# Patient Record
Sex: Female | Born: 1990 | Race: White | Hispanic: No | Marital: Married | Smoking: Never smoker
Health system: Southern US, Community
[De-identification: ages and names within clinical notes are randomized; demographics above are authoritative.]

## PROBLEM LIST (undated history)

## (undated) DIAGNOSIS — F419 Anxiety disorder, unspecified: Secondary | ICD-10-CM

## (undated) HISTORY — DX: Anxiety disorder, unspecified: F41.9

## (undated) HISTORY — PX: WISDOM TOOTH EXTRACTION: SHX21

---
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
## 2015-03-18 ENCOUNTER — Ambulatory Visit (INDEPENDENT_AMBULATORY_CARE_PROVIDER_SITE_OTHER): Payer: Managed Care, Other (non HMO) | Admitting: Physician Assistant

## 2015-03-18 VITALS — BP 116/80 | HR 95 | Temp 98.5°F | Resp 16 | Ht 67.0 in | Wt 139.2 lb

## 2015-03-18 DIAGNOSIS — N926 Irregular menstruation, unspecified: Secondary | ICD-10-CM | POA: Diagnosis not present

## 2015-03-18 LAB — POCT URINE PREGNANCY: PREG TEST UR: NEGATIVE

## 2015-03-18 NOTE — Patient Instructions (Addendum)
I will contact you with your lab results as soon as they are available.   If you have not heard from me in 2 weeks, please contact me.  The fastest way to get your results is to register for My Chart (see the instructions on the last page of this printout).   [facility]  [place] - [phone] [facility] [phone]- [phone] [facility] - [phone] [facility] - [phone]

## 2015-03-18 NOTE — Progress Notes (Signed)
   Erica [name]  MRN: [id number] DOB: 05/27/1991  Subjective:  Pt presents to clinic because her menses is 1.5 weeks late and she has taken multiple pregnancy tests at home but the one this morning had a faint line after the time frame for the test to be read.  She has increased fatigue, breast tenderness and pelvic cramping none of which are normal for her during her menses.  She is typically regular with her menses.  She recent moved to [place] (within the last 2 weeks) for her husbands job at [employer] law [employer].  This has not been stressful to patient.  They have not been trying to prevent pregnancy - she is getting married in 1.5 months and is excited if she is pregnant.  She is on Adderall but she does not take regularly - she has been on a PNV.  She has had rare social ETOH drinks over the last month.    There are no active problems to display for this patient.  No current outpatient prescriptions on file prior to visit.   No current facility-administered medications on file prior to visit.    No Known Allergies  Review of Systems  Constitutional: Negative for chills.  Genitourinary: Negative for dysuria and vaginal bleeding.   Objective:  BP 116/80 mmHg  Pulse 95  Temp(Src) 98.5 F (36.9 C) (Oral)  Resp 16  Ht 5\' 7"  (1.702 m)  Wt 139 lb 3.2 oz (63.141 kg)  BMI 21.80 kg/m2  SpO2 99%  LMP 02/07/2015  Physical Exam  Constitutional: She is oriented to person, place, and time and well-developed, well-nourished, and in no distress.  HENT:  Head: Normocephalic and atraumatic.  Right Ear: Hearing and external ear normal.  Left Ear: Hearing and external ear normal.  Eyes: Conjunctivae are normal.  Neck: Normal range of motion.  Cardiovascular: Normal rate, regular rhythm and normal heart sounds.   No murmur heard. Pulmonary/Chest: Effort normal and breath sounds normal. She has no wheezes.  Neurological: She is alert and oriented to person, place, and time. Gait normal.  Skin:  Skin is warm and dry.  Psychiatric: Mood, memory, affect and judgment normal.  Vitals reviewed.  I reviewed her pregnancy test that she brought in and it appears to have a line suggesting it was positive. Assessment and Plan :  Missed period - Plan: POCT urine pregnancy, hCG, quantitative, pregnancy  Due to how late her menses is and the fact that she brought a positive test from home we will do a quan hcg for confirmation.  She will continue her PNV.  Benny LennertSarah Weber PA-C  [facility] 03/18/2015 5:33 PM

## 2015-03-19 LAB — HCG, QUANTITATIVE, PREGNANCY: hCG, Beta Chain, Quant, S: <2 m[IU]/mL

## 2015-11-10 ENCOUNTER — Encounter: Payer: Self-pay | Admitting: Obstetrics and Gynecology

## 2015-11-10 ENCOUNTER — Ambulatory Visit (INDEPENDENT_AMBULATORY_CARE_PROVIDER_SITE_OTHER): Payer: 59 | Admitting: Obstetrics and Gynecology

## 2015-11-10 VITALS — BP 112/66 | HR 64 | Ht 67.75 in | Wt 145.0 lb

## 2015-11-10 DIAGNOSIS — Z01419 Encounter for gynecological examination (general) (routine) without abnormal findings: Secondary | ICD-10-CM

## 2015-11-10 DIAGNOSIS — O039 Complete or unspecified spontaneous abortion without complication: Secondary | ICD-10-CM | POA: Diagnosis not present

## 2015-11-10 DIAGNOSIS — Z0000 Encounter for general adult medical examination without abnormal findings: Secondary | ICD-10-CM

## 2015-11-10 DIAGNOSIS — Z113 Encounter for screening for infections with a predominantly sexual mode of transmission: Secondary | ICD-10-CM

## 2015-11-10 LAB — HEPATITIS C ANTIBODY: HCV AB: NEGATIVE

## 2015-11-10 LAB — CBC
HEMATOCRIT: 39.6 % (ref 36.0–46.0)
HEMOGLOBIN: 13.6 g/dL (ref 12.0–15.0)
MCH: 30.6 pg (ref 26.0–34.0)
MCHC: 34.3 g/dL (ref 30.0–36.0)
MCV: 89.2 fL (ref 78.0–100.0)
MPV: 10.9 fL (ref 8.6–12.4)
Platelets: 220 10*3/uL (ref 150–400)
RBC: 4.44 MIL/uL (ref 3.87–5.11)
RDW: 12.8 % (ref 11.5–15.5)
WBC: 5.2 10*3/uL (ref 4.0–10.5)

## 2015-11-10 LAB — COMPREHENSIVE METABOLIC PANEL
ALBUMIN: 4.2 g/dL (ref 3.6–5.1)
ALT: 13 U/L (ref 6–29)
AST: 18 U/L (ref 10–30)
Alkaline Phosphatase: 39 U/L (ref 33–115)
BUN: 10 mg/dL (ref 7–25)
CO2: 25 mmol/L (ref 20–31)
CREATININE: 0.64 mg/dL (ref 0.50–1.10)
Calcium: 10.1 mg/dL (ref 8.6–10.2)
Chloride: 105 mmol/L (ref 98–110)
Glucose, Bld: 88 mg/dL (ref 65–99)
POTASSIUM: 4.2 mmol/L (ref 3.5–5.3)
SODIUM: 139 mmol/L (ref 135–146)
TOTAL PROTEIN: 6.7 g/dL (ref 6.1–8.1)
Total Bilirubin: 0.5 mg/dL (ref 0.2–1.2)

## 2015-11-10 LAB — POCT URINALYSIS DIPSTICK
Bilirubin, UA: NEGATIVE
Blood, UA: NEGATIVE
Glucose, UA: NEGATIVE
Ketones, UA: NEGATIVE
LEUKOCYTES UA: NEGATIVE
NITRITE UA: NEGATIVE
PH UA: 7
Protein, UA: NEGATIVE
Urobilinogen, UA: NEGATIVE

## 2015-11-10 LAB — LIPID PANEL
CHOLESTEROL: 191 mg/dL (ref 125–200)
HDL: 66 mg/dL (ref >=46)
LDL Cholesterol: 108 mg/dL (ref <130)
Total CHOL/HDL Ratio: 2.9 Ratio (ref <=5.0)
Triglycerides: 85 mg/dL (ref <150)
VLDL: 17 mg/dL (ref <30)

## 2015-11-10 LAB — TSH: TSH: 2.19 m[IU]/L

## 2015-11-10 LAB — HEMOGLOBIN, FINGERSTICK: HEMOGLOBIN, FINGERSTICK: 13.2 g/dL (ref 12.0–16.0)

## 2015-11-10 NOTE — Patient Instructions (Signed)
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
## 2015-11-11 LAB — GC/CHLAMYDIA PROBE AMP
CT Probe RNA: NOT DETECTED
GC PROBE AMP APTIMA: NOT DETECTED

## 2015-11-11 LAB — STD PANEL
HEP B S AG: NEGATIVE
HIV 1&2 Ab, 4th Generation: NONREACTIVE

## 2015-12-28 ENCOUNTER — Ambulatory Visit (INDEPENDENT_AMBULATORY_CARE_PROVIDER_SITE_OTHER): Payer: Managed Care, Other (non HMO) | Admitting: Family Medicine

## 2015-12-28 VITALS — BP 110/76 | HR 91 | Temp 98.1°F | Resp 16 | Ht 67.0 in | Wt 144.0 lb

## 2015-12-28 DIAGNOSIS — J0100 Acute maxillary sinusitis, unspecified: Secondary | ICD-10-CM

## 2015-12-28 MED ORDER — AMOXICILLIN-POT CLAVULANATE 875-125 MG PO TABS: 1.0000 | ORAL_TABLET | Freq: Two times a day (BID) | ORAL | Status: DC

## 2015-12-28 NOTE — Patient Instructions (Addendum)
  Great to meet you!  Take all of the antibiotics, Augmentin is a strong antibiotic so I recommend eating yogurt daily or taking a pro-biotic.   Sinusitis, Adult Sinusitis is redness, soreness, and puffiness (inflammation) of the air pockets in the bones of your face (sinuses). The redness, soreness, and puffiness can cause air and mucus to get trapped in your sinuses. This can allow germs to grow and cause an infection.  HOME CARE   Drink enough fluids to keep your pee (urine) clear or pale yellow.  Use a humidifier in your home.  Run a hot shower to create steam in the bathroom. Sit in the bathroom with the door closed. Breathe in the steam 3-4 times a day.  Put a warm, moist washcloth on your face 3-4 times a day, or as told by your doctor.  Use salt water sprays (saline sprays) to wet the thick fluid in your nose. This can help the sinuses drain.  Only take medicine as told by your doctor. GET HELP RIGHT AWAY IF:   Your pain gets worse.  You have very bad headaches.  You are sick to your stomach (nauseous).  You throw up (vomit).  You are very sleepy (drowsy) all the time.  Your face is puffy (swollen).  Your vision changes.  You have a stiff neck.  You have trouble breathing. MAKE SURE YOU:   Understand these instructions.  Will watch your condition.  Will get help right away if you are not doing well or get worse.   This information is not intended to replace advice given to you by your health care provider. Make sure you discuss any questions you have with your health care provider.   Document Released: 02/13/2008 Document Revised: 09/17/2014 Document Reviewed: 04/01/2012 Elsevier Interactive Patient Education [employer]2016 Elsevier Inc.

## 2015-12-28 NOTE — Progress Notes (Signed)
   HPI  Patient presents today with flu like symptoms  She c/o 5-6 days of illness, She had fever to 102 over the weekend with headache, and malaise. She has begun to improve but has now developed severe R sided facial pain and congestion for 3 days. .  Her cough is improving. She has also developed sore throat  Her father in law is a Recruitment consultantneonatologist and wondered if she had strep.   She is tolerating food and fluids normally.  She has no dyspnea, she denies chest pain.   PMH: Smoking status noted ROS: Per HPI  Objective: BP 110/76 mmHg  Pulse 91  Temp(Src) 98.1 F (36.7 C) (Oral)  Resp 16  Ht 5\' 7"  (1.702 m)  Wt 144 lb (65.318 kg)  BMI 22.55 kg/m2  SpO2 98%  LMP 12/21/2015 (Approximate) Gen: NAD, alert, cooperative with exam HEENT: NCAT, R maxilar sinus pain to palpation, TMs WNL, oropharynx clear with minimally  CV: RRR, good S1/S2, no murmur Resp: CTABL, no wheezes, non-labored Abd: SNTND, BS present, no guarding or organomegaly Ext: No edema, warm Neuro: Alert and oriented, No gross deficits  Assessment and plan:  # acute maxillary sinusitis Treat with Augmentin Likely preceding flu Discussed usual course of illness.  RTC with any concerns or worsening symptoms.       Meds ordered this encounter  Medications  . Fexofenadine HCl (MUCINEX ALLERGY PO)    Sig: Take by mouth.  Marland Kitchen. amoxicillin-clavulanate (AUGMENTIN) 875-125 MG tablet    Sig: Take 1 tablet by mouth 2 (two) times daily.    Dispense:  20 tablet    Refill:  0    Kevin FentonSamuel Bradshaw, MD 1:50 PM

## 2016-02-01 ENCOUNTER — Telehealth: Payer: Self-pay | Admitting: Obstetrics and Gynecology

## 2016-02-01 NOTE — Telephone Encounter (Signed)
LMTCB patient wants to come in to have blood test done to confirm pregnancy.

## 2016-02-02 ENCOUNTER — Ambulatory Visit (INDEPENDENT_AMBULATORY_CARE_PROVIDER_SITE_OTHER): Payer: Managed Care, Other (non HMO) | Admitting: Obstetrics and Gynecology

## 2016-02-02 ENCOUNTER — Encounter: Payer: Self-pay | Admitting: Obstetrics and Gynecology

## 2016-02-02 VITALS — BP 100/62 | HR 66 | Ht 67.75 in | Wt 148.0 lb

## 2016-02-02 DIAGNOSIS — N926 Irregular menstruation, unspecified: Secondary | ICD-10-CM | POA: Diagnosis not present

## 2016-02-02 DIAGNOSIS — O09291 Supervision of pregnancy with other poor reproductive or obstetric history, first trimester: Secondary | ICD-10-CM

## 2016-02-02 LAB — POCT URINE PREGNANCY: PREG TEST UR: POSITIVE — AB
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
## 2016-02-03 ENCOUNTER — Other Ambulatory Visit (INDEPENDENT_AMBULATORY_CARE_PROVIDER_SITE_OTHER): Payer: Managed Care, Other (non HMO)

## 2016-02-03 ENCOUNTER — Encounter: Payer: Self-pay | Admitting: Obstetrics and Gynecology

## 2016-02-03 DIAGNOSIS — N926 Irregular menstruation, unspecified: Secondary | ICD-10-CM

## 2016-02-03 LAB — HCG, QUANTITATIVE, PREGNANCY
hCG, Beta Chain, Quant, S: 26.9 m[IU]/mL — ABNORMAL HIGH
hCG, Beta Chain, Quant, S: 43 m[IU]/mL — ABNORMAL HIGH

## 2016-02-05 ENCOUNTER — Other Ambulatory Visit: Payer: Self-pay | Admitting: Obstetrics and Gynecology

## 2016-02-05 DIAGNOSIS — Z3490 Encounter for supervision of normal pregnancy, unspecified, unspecified trimester: Secondary | ICD-10-CM

## 2016-02-07 ENCOUNTER — Other Ambulatory Visit (INDEPENDENT_AMBULATORY_CARE_PROVIDER_SITE_OTHER): Payer: Managed Care, Other (non HMO)

## 2016-02-07 DIAGNOSIS — Z3490 Encounter for supervision of normal pregnancy, unspecified, unspecified trimester: Secondary | ICD-10-CM

## 2016-02-07 DIAGNOSIS — Z331 Pregnant state, incidental: Secondary | ICD-10-CM

## 2016-02-07 LAB — HCG, QUANTITATIVE, PREGNANCY: hCG, Beta Chain, Quant, S: 242.2 m[IU]/mL — ABNORMAL HIGH

## 2016-02-08 ENCOUNTER — Telehealth: Payer: Self-pay | Admitting: Obstetrics and Gynecology

## 2016-02-08 ENCOUNTER — Other Ambulatory Visit: Payer: Self-pay | Admitting: Obstetrics and Gynecology

## 2016-02-08 DIAGNOSIS — Z3490 Encounter for supervision of normal pregnancy, unspecified, unspecified trimester: Secondary | ICD-10-CM

## 2016-02-08 NOTE — Telephone Encounter (Signed)
Call to patient. Advised of quant HCG results as directed by Dr Edward JollySilva. Discussed possibility that when levels are not increasing as expected, potential for pregnancy outside the uterus exisits and precautions for ectopic pregnancy discussed. Strict pelvic rest. Instructed to call for bleeding or pain/ MD on call 24/7 or go to MAU/nearest ED. Patient denies any pain or bleeding at present. States she has breast tenderness and fatigue, otherwise feeling well. Lab appointment scheduled for tomorrow at 0830.  Support given. Stressed that BHCG is increasing, it is just important for her to monitor for these signs as a precaution. Ectopic pregnancy is in no way a definitive conclusion at this point, simply taking precautions.  Routing to provider for final review. Patient agreeable to disposition. Will close encounter.

## 2016-02-08 NOTE — Telephone Encounter (Signed)
    Patient calling for lab results 

## 2016-02-08 NOTE — Telephone Encounter (Signed)
Routing to Dr. Edward JollySilva for result review.

## 2016-02-08 NOTE — Telephone Encounter (Signed)
-----   Message from Patton SallesBrook E Amundson C Silva, MD sent at 02/08/2016  5:43 PM EDT ----- Please inform patient that her level is going up but is just slightly under our projected number.  I would like to have her return tomorrow for another beta hCG.  I will place a future order. Ectopic precautions.  No sexual activity.  Call for bleeding or pain.  Too early for an ultrasound at this time.

## 2016-02-09 ENCOUNTER — Telehealth: Payer: Self-pay | Admitting: Emergency Medicine

## 2016-02-09 ENCOUNTER — Other Ambulatory Visit (INDEPENDENT_AMBULATORY_CARE_PROVIDER_SITE_OTHER): Payer: Managed Care, Other (non HMO)

## 2016-02-09 DIAGNOSIS — Z3490 Encounter for supervision of normal pregnancy, unspecified, unspecified trimester: Secondary | ICD-10-CM

## 2016-02-09 DIAGNOSIS — Z331 Pregnant state, incidental: Secondary | ICD-10-CM

## 2016-02-09 LAB — HCG, QUANTITATIVE, PREGNANCY: hCG, Beta Chain, Quant, S: 672.4 m[IU]/mL — ABNORMAL HIGH

## 2016-02-09 NOTE — Telephone Encounter (Signed)
Spoke with patient and she is given results from Dr. Edward JollySilva.  Ultrasound scheduled for 02/16/16 at 0830. Order placed and patient aware she will be contacted to discuss insurance benefits.  Patient advised to continue with instructions for precautions as previously given and to call our office any time or to go to nearest ER if develops any acute abdominal pain, or if has any vaginal bleeding, becomes lightheaded, weak, dizzy or if develops fevers, chills or nausea and vomiting.  Patient verbalized understanding of all instructions and emergency precautions.  Will follow up as scheduled for ultrasound with Dr. Edward JollySilva. Routing to provider for final review. Patient agreeable to disposition. Will close encounter.   cc Harland DingwallSuzy Dixon for insurance pre-certification and patient contact.

## 2016-02-09 NOTE — Telephone Encounter (Signed)
-----   Message from Patton SallesBrook E Amundson C Silva, MD sent at 02/09/2016  1:13 PM EDT ----- Please inform patient of normal increase in the beta hCG.  Please schedule OB ultrasound for next Thursday, June 8th in our office! Please send to precert.  Cc- Claudette LawsAmanda Dixon

## 2016-02-16 ENCOUNTER — Encounter: Payer: Self-pay | Admitting: Obstetrics and Gynecology

## 2016-02-16 ENCOUNTER — Other Ambulatory Visit: Payer: Managed Care, Other (non HMO) | Admitting: Obstetrics and Gynecology

## 2016-02-16 ENCOUNTER — Ambulatory Visit (INDEPENDENT_AMBULATORY_CARE_PROVIDER_SITE_OTHER): Payer: Managed Care, Other (non HMO)

## 2016-02-16 ENCOUNTER — Ambulatory Visit: Payer: Managed Care, Other (non HMO) | Admitting: Obstetrics and Gynecology

## 2016-02-16 ENCOUNTER — Ambulatory Visit (INDEPENDENT_AMBULATORY_CARE_PROVIDER_SITE_OTHER): Payer: Managed Care, Other (non HMO) | Admitting: Obstetrics and Gynecology

## 2016-02-16 ENCOUNTER — Other Ambulatory Visit: Payer: Managed Care, Other (non HMO)

## 2016-02-16 VITALS — BP 112/60 | HR 88 | Ht 67.75 in | Wt 145.0 lb

## 2016-02-16 DIAGNOSIS — Z3490 Encounter for supervision of normal pregnancy, unspecified, unspecified trimester: Secondary | ICD-10-CM

## 2016-02-16 DIAGNOSIS — Z331 Pregnant state, incidental: Secondary | ICD-10-CM

## 2016-02-16 NOTE — Progress Notes (Signed)
Subjective  25 y.o. 481P0010 Married Caucasian female here for pelvic ultrasound for viability ultrasound.   Here with husband today.   Feeling tired and nauseous.  Breast tenderness. No vaginal spotting.  No major cramping.  Has some minor pelvic pressure.   Patient's last menstrual period was 12/21/2015 (exact date).   Positive UPT on 01/31/16.  Quant beta hCGs:  5/25 - 26.9 5/26 - 43 5/30 - 242.2 6/1 - 672.4  Objective   Pelvic ultrasound images and report reviewed with patient.  Uterus - no masses. EMS - IUP with gestational sac consistent with 5 + 6 weeks.  Yols sac seen.  No fetal pole. Ovaries - normal  Right CL cyst Free fluid - no Cervix closed.     Assessment  Early IUP. Size less than dates.  Plan  Discussion of ultrasound findings and early stage of pregnancy.  Return for follow up ultrasound in one week.  Call for pain or bleeding.  [facility] ultrasound and confirmation of EDC. I encouraged early OB care if genetic testing is desired.  _15______ minutes face to face time of which over 50% was spent in counseling.   After visit summary to patient.

## 2016-02-17 NOTE — Telephone Encounter (Signed)
Patient is calling to schedule her follow up ultrasound. Patient would like to schedule 02/23/16.

## 2016-02-17 NOTE — Telephone Encounter (Signed)
Call to patient to discuss benefit and schedule. Left voicemail.

## 2016-02-20 NOTE — Telephone Encounter (Signed)
Patient received My Chart instructions to drink 32 oz water and not to void for ultrasound appointment. She is calling to confirm these instructions. Advised she would be having vaginal ultrasound and these instructions do not apply to her.  Routing to provider for final review. Patient agreeable to disposition. Will close encounter.

## 2016-02-20 NOTE — Telephone Encounter (Signed)
Patient called and said, "I'd like to know whether my upcoming appointment is for a trans-vaginal ultrasound or an abdominal ultrasound." Appointment is 02/23/16.

## 2016-02-23 ENCOUNTER — Ambulatory Visit (INDEPENDENT_AMBULATORY_CARE_PROVIDER_SITE_OTHER): Payer: Managed Care, Other (non HMO)

## 2016-02-23 ENCOUNTER — Ambulatory Visit (INDEPENDENT_AMBULATORY_CARE_PROVIDER_SITE_OTHER): Payer: Managed Care, Other (non HMO) | Admitting: Obstetrics and Gynecology

## 2016-02-23 ENCOUNTER — Encounter: Payer: Self-pay | Admitting: Obstetrics and Gynecology

## 2016-02-23 VITALS — BP 112/62 | HR 88 | Ht 67.75 in | Wt 143.0 lb

## 2016-02-23 DIAGNOSIS — Z3490 Encounter for supervision of normal pregnancy, unspecified, unspecified trimester: Secondary | ICD-10-CM

## 2016-02-23 DIAGNOSIS — Z331 Pregnant state, incidental: Secondary | ICD-10-CM | POA: Diagnosis not present

## 2016-02-23 NOTE — Progress Notes (Signed)
Subjective  25 y.o. 292P0010 Married Caucasian female here for pelvic ultrasound for viability ultrasound.  Husband present for the visit today.  Has nausea.  Taking PNV and occasional fish oil.   Patient plans to travel during pregnancy.  [place] this [place].  Patient's last menstrual period was 12/21/2015 (exact date).  Objective  Pelvic ultrasound images and report reviewed with patient.  Uterus - Viable IUP, 6 +5 weeks.  EDC 10/13/16.  FH 122. Cervix closed.  Ovaries - right CL cyst.  No free fluid.     Assessment   Early pregnancy with viable IUP.   Plan  Discussed Pregnancy Week by Week for an addition to her pregnancy library. Discussed sexual activity in pregnancy. Discussed avoiding types of activities/sports that can cause sudden fall.  Patient will establish care with OB office.  Our best wishes for a healthy pregnancy.  Return to care after postpartum visit.   ____15___ minutes face to face time of which over 50% was spent in counseling.    After visit summary to patient.

## 2016-03-09 ENCOUNTER — Other Ambulatory Visit: Payer: Self-pay | Admitting: Obstetrics and Gynecology

## 2016-03-12 LAB — CYTOLOGY - PAP

## 2016-04-03 LAB — OB RESULTS CONSOLE ANTIBODY SCREEN: Antibody Screen: NEGATIVE

## 2016-04-03 LAB — OB RESULTS CONSOLE ABO/RH: RH Type: POSITIVE

## 2016-04-03 LAB — OB RESULTS CONSOLE GC/CHLAMYDIA
CHLAMYDIA, DNA PROBE: NEGATIVE
GC PROBE AMP, GENITAL: NEGATIVE

## 2016-04-03 LAB — OB RESULTS CONSOLE HEPATITIS B SURFACE ANTIGEN: Hepatitis B Surface Ag: NEGATIVE

## 2016-04-03 LAB — OB RESULTS CONSOLE RUBELLA ANTIBODY, IGM: Rubella: IMMUNE

## 2016-04-03 LAB — OB RESULTS CONSOLE HIV ANTIBODY (ROUTINE TESTING): HIV: NONREACTIVE

## 2016-06-21 ENCOUNTER — Other Ambulatory Visit (HOSPITAL_COMMUNITY): Payer: Self-pay | Admitting: Obstetrics and Gynecology

## 2016-06-21 ENCOUNTER — Encounter (HOSPITAL_COMMUNITY): Payer: Self-pay | Admitting: Obstetrics and Gynecology

## 2016-06-21 DIAGNOSIS — Z3A24 24 weeks gestation of pregnancy: Secondary | ICD-10-CM

## 2016-06-21 DIAGNOSIS — Z3689 Encounter for other specified antenatal screening: Secondary | ICD-10-CM

## 2016-06-21 DIAGNOSIS — O368390 Maternal care for abnormalities of the fetal heart rate or rhythm, unspecified trimester, not applicable or unspecified: Secondary | ICD-10-CM

## 2016-06-29 ENCOUNTER — Encounter (HOSPITAL_COMMUNITY): Payer: Self-pay

## 2016-06-29 ENCOUNTER — Ambulatory Visit (HOSPITAL_COMMUNITY): Payer: Self-pay

## 2016-07-09 ENCOUNTER — Encounter (HOSPITAL_COMMUNITY): Payer: Self-pay | Admitting: Radiology

## 2016-07-10 ENCOUNTER — Ambulatory Visit (HOSPITAL_COMMUNITY): Admission: RE | Admit: 2016-07-10 | Payer: Managed Care, Other (non HMO) | Source: Ambulatory Visit

## 2016-07-10 ENCOUNTER — Ambulatory Visit (HOSPITAL_COMMUNITY)
Admission: RE | Admit: 2016-07-10 | Discharge: 2016-07-10 | Disposition: A | Discharge status: Home / Self Care | Payer: Managed Care, Other (non HMO) | Source: Ambulatory Visit | Attending: Obstetrics and Gynecology | Admitting: Obstetrics and Gynecology

## 2016-07-10 ENCOUNTER — Encounter (HOSPITAL_COMMUNITY): Payer: Self-pay

## 2016-07-10 ENCOUNTER — Other Ambulatory Visit (HOSPITAL_COMMUNITY): Payer: Self-pay | Admitting: Obstetrics and Gynecology

## 2016-07-10 ENCOUNTER — Other Ambulatory Visit (HOSPITAL_COMMUNITY): Payer: Self-pay

## 2016-07-10 DIAGNOSIS — Z3A26 26 weeks gestation of pregnancy: Secondary | ICD-10-CM

## 2016-07-10 DIAGNOSIS — O368390 Maternal care for abnormalities of the fetal heart rate or rhythm, unspecified trimester, not applicable or unspecified: Secondary | ICD-10-CM

## 2016-07-10 DIAGNOSIS — Z363 Encounter for antenatal screening for malformations: Secondary | ICD-10-CM | POA: Diagnosis present

## 2016-07-10 DIAGNOSIS — Z3A24 24 weeks gestation of pregnancy: Secondary | ICD-10-CM

## 2016-07-10 DIAGNOSIS — Z3689 Encounter for other specified antenatal screening: Secondary | ICD-10-CM

## 2016-07-11 ENCOUNTER — Other Ambulatory Visit (HOSPITAL_COMMUNITY): Payer: Self-pay | Admitting: *Deleted

## 2016-07-11 DIAGNOSIS — O368390 Maternal care for abnormalities of the fetal heart rate or rhythm, unspecified trimester, not applicable or unspecified: Secondary | ICD-10-CM

## 2016-07-16 NOTE — Addendum Note (Signed)
Encounter addended by: Heidi DachMelanie A Robb, RN on: 07/16/2016  9:49 AM<BR>    Actions taken: Charge Capture section accepted

## 2016-07-20 LAB — OB RESULTS CONSOLE RPR: RPR: NONREACTIVE

## 2016-08-07 ENCOUNTER — Ambulatory Visit (HOSPITAL_COMMUNITY)
Admission: RE | Admit: 2016-08-07 | Discharge: 2016-08-07 | Disposition: A | Discharge status: Home / Self Care | Payer: Managed Care, Other (non HMO) | Source: Ambulatory Visit | Attending: Obstetrics and Gynecology | Admitting: Obstetrics and Gynecology

## 2016-08-07 ENCOUNTER — Other Ambulatory Visit (HOSPITAL_COMMUNITY): Payer: Self-pay | Admitting: Obstetrics and Gynecology

## 2016-08-07 ENCOUNTER — Encounter (HOSPITAL_COMMUNITY): Payer: Self-pay

## 2016-08-07 DIAGNOSIS — Z3A30 30 weeks gestation of pregnancy: Secondary | ICD-10-CM | POA: Diagnosis present

## 2016-08-07 DIAGNOSIS — O368390 Maternal care for abnormalities of the fetal heart rate or rhythm, unspecified trimester, not applicable or unspecified: Secondary | ICD-10-CM

## 2016-08-07 DIAGNOSIS — O368330 Maternal care for abnormalities of the fetal heart rate or rhythm, third trimester, not applicable or unspecified: Secondary | ICD-10-CM | POA: Diagnosis not present

## 2016-09-10 NOTE — L&D Delivery Note (Signed)
Patient was C/C/+4 and pushed for 5 minutes with epidural.   NSVD  female infant, Apgars 8,9, weight P.   The patient had [name] second degree midline laceration repaired with 2-0 vicryl R. Fundus was firm. EBL was expected amount. Placenta was delivered intact. Vagina was clear.  Baby was vigorous and doing skin to skin with mother.  Erica [name]

## 2016-09-13 LAB — OB RESULTS CONSOLE GBS: STREP GROUP B AG: NEGATIVE

## 2016-10-19 ENCOUNTER — Other Ambulatory Visit: Payer: Self-pay | Admitting: Obstetrics and Gynecology

## 2016-10-19 ENCOUNTER — Encounter (HOSPITAL_COMMUNITY): Payer: Self-pay | Admitting: Anesthesiology

## 2016-10-19 ENCOUNTER — Inpatient Hospital Stay (HOSPITAL_COMMUNITY): Payer: Managed Care, Other (non HMO) | Admitting: Anesthesiology

## 2016-10-19 ENCOUNTER — Inpatient Hospital Stay (HOSPITAL_COMMUNITY)
Admission: AD | Admit: 2016-10-19 | Discharge: 2016-10-22 | DRG: 775 | Disposition: A | Discharge status: Home / Self Care | Payer: Managed Care, Other (non HMO) | Source: Ambulatory Visit | Attending: Obstetrics and Gynecology | Admitting: Obstetrics and Gynecology

## 2016-10-19 DIAGNOSIS — Z3A40 40 weeks gestation of pregnancy: Secondary | ICD-10-CM | POA: Diagnosis not present

## 2016-10-19 DIAGNOSIS — Z3493 Encounter for supervision of normal pregnancy, unspecified, third trimester: Secondary | ICD-10-CM | POA: Diagnosis present

## 2016-10-19 LAB — CBC
HEMATOCRIT: 35.2 % — AB (ref 36.0–46.0)
HEMOGLOBIN: 12.5 g/dL (ref 12.0–15.0)
MCH: 30.6 pg (ref 26.0–34.0)
MCHC: 35.5 g/dL (ref 30.0–36.0)
MCV: 86.1 fL (ref 78.0–100.0)
Platelets: 192 10*3/uL (ref 150–400)
RBC: 4.09 MIL/uL (ref 3.87–5.11)
RDW: 12.8 % (ref 11.5–15.5)
WBC: 11.8 10*3/uL — AB (ref 4.0–10.5)

## 2016-10-19 LAB — TYPE AND SCREEN
ABO/RH(D): B POS
Antibody Screen: NEGATIVE

## 2016-10-19 LAB — ABO/RH: ABO/RH(D): B POS

## 2016-10-19 MED ORDER — TERBUTALINE SULFATE 1 MG/ML IJ SOLN
0.2500 mg | Freq: Once | INTRAMUSCULAR | Status: DC | PRN
  Filled 2016-10-19: qty 1

## 2016-10-19 MED ORDER — OXYCODONE-ACETAMINOPHEN 5-325 MG PO TABS: 2.0000 | ORAL_TABLET | ORAL | Status: DC | PRN

## 2016-10-19 MED ORDER — ACETAMINOPHEN 325 MG PO TABS: 650.0000 mg | ORAL_TABLET | ORAL | Status: DC | PRN

## 2016-10-19 MED ORDER — BUTORPHANOL TARTRATE 1 MG/ML IJ SOLN
1.0000 mg | INTRAMUSCULAR | Status: DC | PRN
  Administered 2016-10-19: 1 mg via INTRAVENOUS

## 2016-10-19 MED ORDER — DIPHENHYDRAMINE HCL 50 MG/ML IJ SOLN: 12.5000 mg | INTRAMUSCULAR | Status: DC | PRN

## 2016-10-19 MED ORDER — LACTATED RINGERS IV SOLN
INTRAVENOUS | Status: DC
  Administered 2016-10-19 (×2): via INTRAVENOUS

## 2016-10-19 MED ORDER — OXYTOCIN BOLUS FROM INFUSION: 500.0000 mL | Freq: Once | INTRAVENOUS | Status: DC

## 2016-10-19 MED ORDER — PHENYLEPHRINE 40 MCG/ML (10ML) SYRINGE FOR IV PUSH (FOR BLOOD PRESSURE SUPPORT)
80.0000 ug | PREFILLED_SYRINGE | INTRAVENOUS | Status: DC | PRN
  Filled 2016-10-19: qty 10
  Filled 2016-10-19: qty 5
  Filled 2016-10-19: qty 10

## 2016-10-19 MED ORDER — ONDANSETRON HCL 4 MG/2ML IJ SOLN: 4.0000 mg | Freq: Four times a day (QID) | INTRAMUSCULAR | Status: DC | PRN

## 2016-10-19 MED ORDER — OXYTOCIN 40 UNITS IN LACTATED RINGERS INFUSION - SIMPLE MED: 2.5000 [IU]/h | INTRAVENOUS | Status: DC

## 2016-10-19 MED ORDER — FLEET ENEMA 7-19 GM/118ML RE ENEM: 1.0000 | ENEMA | Freq: Every day | RECTAL | Status: DC | PRN

## 2016-10-19 MED ORDER — LACTATED RINGERS IV SOLN
500.0000 mL | Freq: Once | INTRAVENOUS | Status: AC
  Administered 2016-10-19: 500 mL via INTRAVENOUS

## 2016-10-19 MED ORDER — BUTORPHANOL TARTRATE 1 MG/ML IJ SOLN
INTRAMUSCULAR | Status: AC
  Filled 2016-10-19: qty 1

## 2016-10-19 MED ORDER — LACTATED RINGERS IV SOLN: 500.0000 mL | INTRAVENOUS | Status: DC | PRN

## 2016-10-19 MED ORDER — FENTANYL 2.5 MCG/ML BUPIVACAINE 1/10 % EPIDURAL INFUSION (WH - ANES)
14.0000 mL/h | INTRAMUSCULAR | Status: DC | PRN
  Administered 2016-10-19: 14 mL/h via EPIDURAL
  Filled 2016-10-19: qty 100

## 2016-10-19 MED ORDER — SODIUM BICARBONATE 8.4 % IV SOLN
INTRAVENOUS | Status: DC | PRN
  Administered 2016-10-19 (×2): 5 mL via EPIDURAL

## 2016-10-19 MED ORDER — PHENYLEPHRINE 40 MCG/ML (10ML) SYRINGE FOR IV PUSH (FOR BLOOD PRESSURE SUPPORT)
80.0000 ug | PREFILLED_SYRINGE | INTRAVENOUS | Status: DC | PRN
  Filled 2016-10-19: qty 5

## 2016-10-19 MED ORDER — LIDOCAINE HCL (PF) 1 % IJ SOLN
INTRAMUSCULAR | Status: DC | PRN
  Administered 2016-10-19: 9 mL via EPIDURAL
  Administered 2016-10-19 (×2): 6 mL via EPIDURAL

## 2016-10-19 MED ORDER — SOD CITRATE-CITRIC ACID 500-334 MG/5ML PO SOLN: 30.0000 mL | ORAL | Status: DC | PRN

## 2016-10-19 MED ORDER — OXYCODONE-ACETAMINOPHEN 5-325 MG PO TABS: 1.0000 | ORAL_TABLET | ORAL | Status: DC | PRN

## 2016-10-19 MED ORDER — EPHEDRINE 5 MG/ML INJ
10.0000 mg | INTRAVENOUS | Status: DC | PRN
  Filled 2016-10-19: qty 4

## 2016-10-19 MED ORDER — LIDOCAINE HCL (PF) 1 % IJ SOLN
30.0000 mL | INTRAMUSCULAR | Status: DC | PRN
  Filled 2016-10-19: qty 30

## 2016-10-19 MED ORDER — OXYTOCIN 40 UNITS IN LACTATED RINGERS INFUSION - SIMPLE MED
1.0000 m[IU]/min | INTRAVENOUS | Status: DC
  Administered 2016-10-19: 2 m[IU]/min via INTRAVENOUS
  Filled 2016-10-19: qty 1000

## 2016-10-19 NOTE — Progress Notes (Signed)
Pt walking on unit with doula and FOB.
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
## 2016-10-19 NOTE — Anesthesia Procedure Notes (Signed)
Epidural Patient location during procedure: OB Start time: 10/19/2016 8:37 PM End time: 10/19/2016 8:40 PM  Staffing Anesthesiologist: Leilani AbleHATCHETT, Mariyanna Mucha Performed: anesthesiologist   Preanesthetic Checklist Completed: patient identified, surgical consent, pre-op evaluation, timeout performed, IV checked, risks and benefits discussed and monitors and equipment checked  Epidural Patient position: sitting Prep: site prepped and draped and DuraPrep Patient monitoring: continuous pulse ox and blood pressure Approach: midline Location: L3-L4 Injection technique: LOR air  Needle:  Needle type: Tuohy  Needle gauge: 17 G Needle length: 9 cm and 9 Needle insertion depth: 6 cm Catheter type: closed end flexible Catheter size: 19 Gauge Catheter at skin depth: 11 cm Test dose: negative and Other  Assessment Sensory level: T9 Events: blood not aspirated, injection not painful, no injection resistance, negative IV test and no paresthesia  Additional Notes Reason for block:procedure for pain

## 2016-10-19 NOTE — Anesthesia Pain Management Evaluation Note (Signed)
  CRNA Pain Management Visit Note  Patient: Erica [name], 26 y.o., female  "Hello [facility] am a [facility]. We have an anesthesia team available at all times to provide care throughout the hospital, including epidural management and anesthesia for C-section. I don't know your plan for the delivery whether it a natural birth, water birth, IV sedation, nitrous supplementation, doula or epidural, but we want to meet your pain goals."   1.Was your pain managed to your expectations on prior hospitalizations?   No prior hospitalizations  2.What is your expectation for pain management during this hospitalization?     Epidural and Nitrous Oxide  3.How can we help you reach that goal? N2O, possibly epidural.  Record the patient's initial score and the patient's pain goal.   Pain: 0  Pain Goal: 6 [facility] wants you to be able to say your pain was always managed very well.  Billi Bright L 10/19/2016

## 2016-10-19 NOTE — Progress Notes (Signed)
Pt alternating standing at bedside and sitting on birthing ball.  Doula at bedside for support.

## 2016-10-19 NOTE — H&P (Deleted)
  The note originally documented on this encounter has been moved the the encounter in which it belongs.  

## 2016-10-19 NOTE — Anesthesia Preprocedure Evaluation (Signed)
Anesthesia Evaluation  Patient identified by MRN, date of birth, ID band Patient awake    Reviewed: Allergy & Precautions, H&P , NPO status , Patient's Chart, lab work & pertinent test results  Airway Mallampati: I  TM Distance: >3 FB Neck ROM: full    Dental no notable dental hx.    Pulmonary neg pulmonary ROS,    Pulmonary exam normal        Cardiovascular negative cardio ROS Normal cardiovascular exam     Neuro/Psych negative neurological ROS     GI/Hepatic negative GI ROS, Neg liver ROS,   Endo/Other  negative endocrine ROS  Renal/GU negative Renal ROS     Musculoskeletal   Abdominal Normal abdominal exam  (+)   Peds  Hematology negative hematology ROS (+)   Anesthesia Other Findings   Reproductive/Obstetrics (+) Pregnancy                             Anesthesia Physical Anesthesia Plan  ASA: II  Anesthesia Plan: Epidural   Post-op Pain Management:    Induction:   Airway Management Planned:   Additional Equipment:   Intra-op Plan:   Post-operative Plan:   Informed Consent: I have reviewed the patients History and Physical, chart, labs and discussed the procedure including the risks, benefits and alternatives for the proposed anesthesia with the patient or authorized representative who has indicated his/her understanding and acceptance.     Plan Discussed with:   Anesthesia Plan Comments:         Anesthesia Quick Evaluation  

## 2016-10-19 NOTE — Anesthesia Procedure Notes (Signed)
Epidural Patient location during procedure: OB Start time: 10/19/2016 11:35 AM End time: 10/19/2016 11:37 AM  Staffing Anesthesiologist: Leilani AbleHATCHETT, Patton Swisher Performed: anesthesiologist   Preanesthetic Checklist Completed: patient identified, surgical consent, pre-op evaluation, timeout performed, IV checked, risks and benefits discussed and monitors and equipment checked  Epidural Patient position: sitting Prep: site prepped and draped and DuraPrep Patient monitoring: continuous pulse ox and blood pressure Approach: midline Location: L3-L4 Injection technique: LOR air  Needle:  Needle type: Tuohy  Needle gauge: 17 G Needle length: 9 cm and 9 Needle insertion depth: 4 cm Catheter type: closed end flexible Catheter size: 19 Gauge Catheter at skin depth: 9 cm Test dose: negative  Assessment Events: blood not aspirated, injection not painful, no injection resistance, negative IV test and no paresthesia  Additional Notes Reason for block:procedure for pain

## 2016-10-19 NOTE — Progress Notes (Signed)
Doula at bedside.  Reviewed birth plan.

## 2016-10-20 ENCOUNTER — Encounter (HOSPITAL_COMMUNITY): Payer: Self-pay | Admitting: *Deleted

## 2016-10-20 LAB — RPR: RPR Ser Ql: NONREACTIVE

## 2016-10-20 LAB — CBC
HEMATOCRIT: 31.6 % — AB (ref 36.0–46.0)
HEMOGLOBIN: 11.1 g/dL — AB (ref 12.0–15.0)
MCH: 30.3 pg (ref 26.0–34.0)
MCHC: 35.1 g/dL (ref 30.0–36.0)
MCV: 86.3 fL (ref 78.0–100.0)
Platelets: 169 10*3/uL (ref 150–400)
RBC: 3.66 MIL/uL — ABNORMAL LOW (ref 3.87–5.11)
RDW: 12.7 % (ref 11.5–15.5)
WBC: 18.1 10*3/uL — AB (ref 4.0–10.5)

## 2016-10-20 MED ORDER — BENZOCAINE-MENTHOL 20-0.5 % EX AERO
1.0000 "application " | INHALATION_SPRAY | CUTANEOUS | Status: DC | PRN
  Administered 2016-10-20: 1 via TOPICAL
  Filled 2016-10-20: qty 56

## 2016-10-20 MED ORDER — MEASLES, MUMPS & RUBELLA VAC ~~LOC~~ INJ
0.5000 mL | INJECTION | Freq: Once | SUBCUTANEOUS | Status: DC
  Filled 2016-10-20: qty 0.5

## 2016-10-20 MED ORDER — MAGNESIUM HYDROXIDE 400 MG/5ML PO SUSP: 30.0000 mL | ORAL | Status: DC | PRN

## 2016-10-20 MED ORDER — OXYCODONE-ACETAMINOPHEN 5-325 MG PO TABS: 2.0000 | ORAL_TABLET | ORAL | Status: DC | PRN

## 2016-10-20 MED ORDER — PRENATAL MULTIVITAMIN CH
1.0000 | ORAL_TABLET | Freq: Every day | ORAL | Status: DC
  Administered 2016-10-20 – 2016-10-22 (×3): 1 via ORAL
  Filled 2016-10-20 (×3): qty 1

## 2016-10-20 MED ORDER — TETANUS-DIPHTH-ACELL PERTUSSIS 5-2.5-18.5 LF-MCG/0.5 IM SUSP: 0.5000 mL | Freq: Once | INTRAMUSCULAR | Status: DC

## 2016-10-20 MED ORDER — COCONUT OIL OIL
1.0000 | TOPICAL_OIL | Status: DC | PRN
Start: 2016-10-20 — End: 2016-10-22
  Administered 2016-10-20: 1 via TOPICAL
  Filled 2016-10-20: qty 120

## 2016-10-20 MED ORDER — ONDANSETRON HCL 4 MG PO TABS: 4.0000 mg | ORAL_TABLET | ORAL | Status: DC | PRN

## 2016-10-20 MED ORDER — DIBUCAINE 1 % RE OINT: 1.0000 "application " | TOPICAL_OINTMENT | RECTAL | Status: DC | PRN

## 2016-10-20 MED ORDER — ACETAMINOPHEN 325 MG PO TABS: 650.0000 mg | ORAL_TABLET | ORAL | Status: DC | PRN

## 2016-10-20 MED ORDER — FERROUS SULFATE 325 (65 FE) MG PO TABS
325.0000 mg | ORAL_TABLET | Freq: Two times a day (BID) | ORAL | Status: DC
  Administered 2016-10-20 – 2016-10-22 (×5): 325 mg via ORAL
  Filled 2016-10-20 (×5): qty 1

## 2016-10-20 MED ORDER — SODIUM CHLORIDE 0.9% FLUSH: 3.0000 mL | INTRAVENOUS | Status: DC | PRN

## 2016-10-20 MED ORDER — METHYLERGONOVINE MALEATE 0.2 MG PO TABS: 0.2000 mg | ORAL_TABLET | ORAL | Status: DC | PRN

## 2016-10-20 MED ORDER — ZOLPIDEM TARTRATE 5 MG PO TABS: 5.0000 mg | ORAL_TABLET | Freq: Every evening | ORAL | Status: DC | PRN

## 2016-10-20 MED ORDER — ONDANSETRON HCL 4 MG/2ML IJ SOLN: 4.0000 mg | INTRAMUSCULAR | Status: DC | PRN

## 2016-10-20 MED ORDER — SENNOSIDES-DOCUSATE SODIUM 8.6-50 MG PO TABS
2.0000 | ORAL_TABLET | ORAL | Status: DC
  Filled 2016-10-20: qty 2

## 2016-10-20 MED ORDER — METHYLERGONOVINE MALEATE 0.2 MG/ML IJ SOLN: 0.2000 mg | INTRAMUSCULAR | Status: DC | PRN

## 2016-10-20 MED ORDER — SIMETHICONE 80 MG PO CHEW
80.0000 mg | CHEWABLE_TABLET | ORAL | Status: DC | PRN
  Administered 2016-10-21: 80 mg via ORAL

## 2016-10-20 MED ORDER — WITCH HAZEL-GLYCERIN EX PADS
1.0000 "application " | MEDICATED_PAD | CUTANEOUS | Status: DC | PRN
  Administered 2016-10-22: 1 via TOPICAL

## 2016-10-20 MED ORDER — DIPHENHYDRAMINE HCL 25 MG PO CAPS: 25.0000 mg | ORAL_CAPSULE | Freq: Four times a day (QID) | ORAL | Status: DC | PRN

## 2016-10-20 MED ORDER — SODIUM CHLORIDE 0.9% FLUSH: 3.0000 mL | Freq: Two times a day (BID) | INTRAVENOUS | Status: DC

## 2016-10-20 MED ORDER — SODIUM CHLORIDE 0.9 % IV SOLN: 250.0000 mL | INTRAVENOUS | Status: DC | PRN

## 2016-10-20 MED ORDER — IBUPROFEN 800 MG PO TABS
800.0000 mg | ORAL_TABLET | Freq: Three times a day (TID) | ORAL | Status: DC
  Administered 2016-10-20 – 2016-10-22 (×8): 800 mg via ORAL
  Filled 2016-10-20 (×8): qty 1

## 2016-10-20 MED ORDER — OXYCODONE-ACETAMINOPHEN 5-325 MG PO TABS
1.0000 | ORAL_TABLET | ORAL | Status: DC | PRN
  Administered 2016-10-20: 1 via ORAL
  Filled 2016-10-20: qty 1

## 2016-10-20 NOTE — Anesthesia Postprocedure Evaluation (Signed)
Anesthesia Post Note  Patient: Lana Fishatalie Langsam  Procedure(s) Performed: * No procedures listed *  Patient location during evaluation: Mother Baby Anesthesia Type: Epidural Level of consciousness: awake and alert and oriented Pain management: satisfactory to patient Vital Signs Assessment: post-procedure vital signs reviewed and stable Respiratory status: spontaneous breathing and nonlabored ventilation Cardiovascular status: stable Postop Assessment: no headache, no backache, no signs of nausea or vomiting, adequate PO intake and patient able to bend at knees (patient up walking) Anesthetic complications: no        Last Vitals:  Vitals:   10/20/16 0400 10/20/16 0500  BP: 127/71 131/65  Pulse: 71 83  Resp: 18 18  Temp: 36.8 C     Last Pain:  Vitals:   10/20/16 0500  TempSrc:   PainSc: 5    Pain Goal:                 Kani Jobson

## 2016-10-20 NOTE — Lactation Note (Signed)
This note was copied from a baby's chart. Lactation Consultation Note  Patient Name: Erica [name] Reason for consult: Initial assessment Baby at 17 hr of life. Mom desires to ebf. "I was heart broken when they gave her a bottle". Parent were told by RN that baby has not eaten since birth and was sleepy so she needed formula. PGF is a NICU MD and agreed that "baby should have formula if she was showing symptoms of low blood sugar". Baby has been spitting up "a lot" today. Mom has firm breast with erect nipples. Colostrum was easily expressed. Baby has a nice gape, her palate seems slightly close to the gum ridge like it is forward of normal placement, and rhythmic suck. Baby would latch and take 2-3 sucks then stop. Applied #20 NS and baby was able to maintain longer bursts of sucking. Discussed baby behavior, feeding frequency, baby belly size, voids, wt loss, breast changes, and nipple care. Given lactation handouts. Aware of OP services and support group.      Maternal Data Has patient been taught Hand Expression?: Yes Does the patient have breastfeeding experience prior to this delivery?: No  Feeding Feeding Type: Breast Fed  LATCH Score/Interventions Latch: Repeated attempts needed to sustain latch, nipple held in mouth throughout feeding, stimulation needed to elicit sucking reflex. Intervention(s): Adjust position;Assist with latch;Breast massage;Breast compression  Audible Swallowing: A few with stimulation Intervention(s): Hand expression;Skin to skin Intervention(s): Alternate breast massage  Type of Nipple: Everted at rest and after stimulation  Comfort (Breast/Nipple): Soft / non-tender     Hold (Positioning): Assistance needed to correctly position infant at breast and maintain latch. Intervention(s): Position options;Support Pillows  LATCH Score: 7  Lactation Tools Discussed/Used Tools: Nipple Shields Nipple shield size:  20   Consult Status Consult Status: Follow-up Date: 10/21/16 Follow-up type: In-patient    Erica [name] [name], 7:21 PM

## 2016-10-20 NOTE — Lactation Note (Signed)
This note was copied from a baby's chart. Lactation Consultation Note  Patient Name: Girl Lana Fishatalie Eslinger ZOXWR'UToday's Date: 10/20/2016 Reason for consult: Follow-up assessment Baby at 21 hr of life. Upon entry baby was screaming and mom was frustrated. Mom stated that baby has been on the breast for the last 45 minutes. Mom stated she is very sleepy and worried that baby is not eating enough. Baby is at 5.9% wt loss. Suggested supplementing and mom did not like the idea of the baby getting another bottle. She was agreeable to the 5Fr at the breast. Baby tolerated the bf well. A few minutes after baby came off the breast she spit up meconium tinged mucus. Now mom is worried that she over fed the baby. Parent are aware of lactation services and support group.   Maternal Data    Feeding Feeding Type: Breast Fed Length of feed: 20 min  LATCH Score/Interventions Latch: Grasps breast easily, tongue down, lips flanged, rhythmical sucking.  Audible Swallowing: Spontaneous and intermittent  Type of Nipple: Everted at rest and after stimulation  Comfort (Breast/Nipple): Soft / non-tender     Hold (Positioning): Full assist, staff holds infant at breast  LATCH Score: 8  Lactation Tools Discussed/Used Tools: 5F feeding tube / Syringe;Nipple Shields Nipple shield size: 20   Consult Status Consult Status: Follow-up Date: 10/21/16 Follow-up type: In-patient    Rulon Eisenmengerlizabeth E Gayna Braddy 10/20/2016, 11:49 PM

## 2016-10-20 NOTE — Progress Notes (Signed)
Patient is eating, ambulating, voiding.  Pain control is good.  Vitals:   10/20/16 0306 10/20/16 0400 10/20/16 0500 10/20/16 0926  BP: 102/86 127/71 131/65 110/63  Pulse: 79 71 83 80  Resp: 20 18 18 20   Temp:  98.3 F (36.8 C)  98.1 F (36.7 C)  TempSrc:  Oral  Oral  SpO2:  98% 100%   Weight:      Height:        Fundus firm Perineum without swelling.  Lab Results  Component Value Date   WBC 18.1 (H) 10/20/2016   HGB 11.1 (L) 10/20/2016   HCT 31.6 (L) 10/20/2016   MCV 86.3 10/20/2016   PLT 169 10/20/2016    --/--/B POS, B POS (02/09 1310)/RI  A/P Post partum day 0.  Routine care.  Expect d/c routine.    Kiylee Thoreson A

## 2016-10-20 NOTE — Discharge Summary (Signed)
Obstetric Discharge Summary Reason for Admission: onset of labor Prenatal Procedures: NST Intrapartum Procedures: spontaneous vaginal delivery Postpartum Procedures: none Complications-Operative and Postpartum: 2 degree perineal laceration Hemoglobin  Date Value Ref Range Status  10/20/2016 11.1 (L) 12.0 - 15.0 g/dL Final   Hemoglobin, fingerstick  Date Value Ref Range Status  11/10/2015 13.2 12.0 - 16.0 g/dL Final   HCT  Date Value Ref Range Status  10/20/2016 31.6 (L) 36.0 - 46.0 % Final    Discharge Diagnoses: Term Pregnancy-delivered  Discharge Information: Date: 10/20/2016 Activity: pelvic rest Diet: routine Medications: Ibuprofen Condition: stable Instructions: refer to practice specific booklet Discharge to: home Follow-up Information    Erica Matty A, MD Follow up in 4 week(s).   Specialty:  Obstetrics and Gynecology Contact information: [address] [place] [id number] [phone]           Newborn Data: Live born female  Birth Weight: 7 lb 2.1 oz (3235 g) APGAR: 8, 9  Home with mother.  Erica [name] 10/20/2016, 10:03 AM

## 2016-10-21 NOTE — Progress Notes (Signed)
MOB was referred for history of anxiety.  Referral is screened out by Clinical Social Worker because none of the following criteria appear to apply and there are no reports impacting the pregnancy or her transition to the postpartum period. CSW does not deem it clinically necessary to further investigate at this time.  -History of anxiety during this pregnancy, or of post-partum depression. - Diagnosis of anxiety within last 3 years - History of anxiety due to pregnancy loss/loss of child or -MOB's symptoms are currently being treated with medication and/or therapy.  CSW attempted to meet with MOB at bedside to complete assessment for consult regarding hx of anxiety. At this time, MOB was asleep and could not complete assessment. This writer completed chart review of MOB and noticed there is no record of a dx of anxiety; thus CSW will screen MOB out as there is no clinical reason to further assess. Please contact the Clinical Social Worker if needs arise or upon MOB request.   Erica [name], MSW, LCSW-A Clinical Social Worker  [facility]  Office: [phone]  

## 2016-10-21 NOTE — Lactation Note (Addendum)
This note was copied from a baby's chart. Lactation Consultation Note  Baby 8736 hours old.  Baby latched w/ 5 french feeding tube under #20NS.  Baby received approx 4 ml while breastfeeding. Baby breastfed for 25 min.  Rhythmical sucks and swallows observed. Encouraged mother to breastfeed on both breasts per feeding. Attempted without NS and baby sustained latch for another 5 min and fell asleep. Prior to entering parents gave baby approx 3 ml of colostrum. Encouraged mother to massage/compress breast during feeding. Mother would really like to give baby only breastmilk but had not yet been set up with DEBP. Set up DEBP.  Recommend she post pump 4-6 times a day for 10-20 min and give volume back to baby at next feeding. Discussed milk storage and cleaning. Mother pumped 5 ml which she will give to baby when she cues in addition to bf. Discussed using curved tip syringe to give to baby in nipple shield. Reassured mother she is doing well.  Answered questions. Mother has volume guidelines but prefers to stay on low side of volume. Discussed waking baby after 3 hours and placing her STS. Mom encouraged to feed baby 8-12 times/24 hours and with feeding cues.      Patient Name: Erica Lana Fishatalie Schechter ZOXWR'UToday's Date: [name] Reason for consult: Follow-up assessment   Maternal Data    Feeding Feeding Type: Breast Fed Length of feed: 25 min  LATCH Score/Interventions Latch: Grasps breast easily, tongue down, lips flanged, rhythmical sucking. Intervention(s): Breast massage  Audible Swallowing: Spontaneous and intermittent Intervention(s): Skin to skin Intervention(s): Alternate breast massage  Type of Nipple: Everted at rest and after stimulation  Comfort (Breast/Nipple): Soft / non-tender     Hold (Positioning): Assistance needed to correctly position infant at breast and maintain latch.  LATCH Score: 9  Lactation Tools Discussed/Used Tools: Nipple Shields;71F feeding tube  / Syringe Nipple shield size: 20   Consult Status Consult Status: Follow-up Date: 10/22/16 Follow-up type: In-patient    Erica [name], Erica [name] [name], 3:09 PM

## 2016-10-21 NOTE — Progress Notes (Signed)
Patient is eating, ambulating, voiding.  Pain control is good.  Vitals:   10/20/16 0500 10/20/16 0926 10/20/16 2005 10/21/16 0656  BP: 131/65 110/63 113/60 (!) 114/51  Pulse: 83 80 75 74  Resp: 18 20 18 18   Temp:  98.1 F (36.7 C) 98 F (36.7 C)   TempSrc:  Oral Oral   SpO2: 100%  100%   Weight:      Height:        Fundus firm Perineum without swelling.  Lab Results  Component Value Date   WBC 18.1 (H) 10/20/2016   HGB 11.1 (L) 10/20/2016   HCT 31.6 (L) 10/20/2016   MCV 86.3 10/20/2016   PLT 169 10/20/2016    --/--/B POS, B POS (02/09 1310)/RI  [name]/P Post partum day 1.  Routine care.  Expect d/c routine.    Erica [name]

## 2016-10-22 MED ORDER — OXYCODONE-ACETAMINOPHEN 2.5-325 MG PO TABS: 1.0000 | ORAL_TABLET | ORAL | 0 refills | Status: AC | PRN

## 2016-10-22 NOTE — Lactation Note (Addendum)
This note was copied from a baby's chart. Lactation Consultation Note  Returned to room.  Mother awake.  Set up OP appointment for Friday 2/16 at 2:30p.  Baby latched in football hold w/ #16 nipple shield.  Sucks and swallows observed. Mother compressing breast to keep baby active. Suggest attempting without nipple shield half way through feedings and be sure to breastfeed on both breasts per session if baby is willing.  Plan for discharge. Attempt breastfeeding first without nipple shield if baby is not frantic.  Use breast compression. If baby does not latch, apply #16 or #20NS (whichever is comfortable). If baby seems to need more volume stimulation, put 5 french under NS with pumped breastmilk or formula (whichever is available). Breastfeed on both breasts if possible.  Observe for swallows. Mom encouraged to feed baby 8-12 times/24 hours and with feeding cues.  Wake baby for feedings if needed.  Place baby skin to skin often to interest in feeding. If baby will not feed at breast, supplement afterwards with slow flow nipple. Follow volume guidelines for supplementation. Reviewed engorgement care and monitoring voids/stools. If using nipple shield continue to post pump 10-20 min 4-6 times a day and give volume back to baby.     Patient Name: Girl Lana Fishatalie Lady WUJWJ'XToday's Date: 10/22/2016     Maternal Data    Feeding    LATCH Score/Interventions                      Lactation Tools Discussed/Used     Consult Status      Dahlia ByesBerkelhammer, Nickolas Chalfin [facility] 10/22/2016, 9:53 AM

## 2016-10-22 NOTE — Progress Notes (Signed)
Bedside RN informed CSW of MOB's high anxiety noted over night.  CSW attempted to meet with MOB, but when CSW entered room, FOB stated that she was sleeping (behind curtain) and that she "really needs some sleep."  CSW agrees completely and will attempt again to speak with MOB later today. 

## 2016-10-22 NOTE — Progress Notes (Signed)
PPD#2 Pt with out complaints. Would like to go home.  VSSAF IMP/ Stable Plan/ Discharge

## 2016-10-22 NOTE — Progress Notes (Signed)
Erica FishNatalie [name] declines waiting for social service, Erica RidingColleen Shaw.  Postpartum depression and coping with anxiety are reviewed with Erica [name] and her support person. They state understanding information. Erica [name] states she has family support systems.

## 2016-10-22 NOTE — Lactation Note (Addendum)
This note was copied from a baby's chart. Lactation Consultation Note RN reported moms breast are filling and mom getting engorged. When LC entered rm. Mom holding baby in cradle position wearing #20 NS to LT breast.baby very aggressive acting at breast. Mom stated baby had BF for 60 min, then 30 min at this time.  Moms breast are tender w/mild deep knots noted in ducts. Encouraged breast massage and post pump after BF.  Baby had been poor BF w/little interest. Mom had been doing SNS w/Alimentum. Instructed mom to use SNS at this time to see if mom will drain breast well. Discussed that should fill baby up after a good feeding.  Mom is to use DEBP after BF. Mom can supplement w/colostrum after BF w/curve tip syring. Unlatched from Lt. Breast to assess for transfer of colostrum. #20 NS noted no puddle of colostrum, just wetness. Fitted #16 NS to Lt. Nipple. Mom stated very comfortable. After suckling for a few minutes unlatched. NS pedaled w/colostrum. Baby fell asleep, very satisfied. Switched baby to Rt. breast w/#16 NS,noted colostrum but uncomfortable to mom. Applied #20 NS, mom stated more comfortable. Noted colostrum puddle. Encouraged breast massage during bf at intervals.  Worked w/mom on positioning and props.  Patient Name: Erica [name] GEXBM'WToday's Date: [name] Reason for consult: Follow-up assessment;Breast/nipple pain   Maternal Data    Feeding Feeding Type: Breast Fed Length of feed: 60 min  LATCH Score/Interventions Latch: Grasps breast easily, tongue down, lips flanged, rhythmical sucking. Intervention(s): Skin to skin;Teach feeding cues Intervention(s): Adjust position;Assist with latch;Breast massage;Breast compression  Audible Swallowing: Spontaneous and intermittent Intervention(s): Skin to skin;Hand expression Intervention(s): Alternate breast massage  Type of Nipple: Everted at rest and after stimulation  Comfort (Breast/Nipple): Filling, red/small  blisters or bruises, mild/mod discomfort  Problem noted: Filling;Mild/Moderate discomfort Interventions (Filling): Double electric pump;Frequent nursing;Firm support;Massage Interventions (Mild/moderate discomfort): Post-pump;Breast shields  Hold (Positioning): Assistance needed to correctly position infant at breast and maintain latch. Intervention(s): Breastfeeding basics reviewed;Support Pillows;Position options;Skin to skin  LATCH Score: 8  Lactation Tools Discussed/Used Tools: Pump;Nipple Shields Nipple shield size: 16;20 Breast pump type: Double-Electric Breast Pump Pump Review: Setup, frequency, and cleaning;Milk Storage Initiated by:: RN Date initiated:: 10/21/16   Consult Status Consult Status: Follow-up Date: 10/22/16 Follow-up type: In-patient    Charyl DancerCARVER, Erica [name], Erica [name]

## 2016-10-26 ENCOUNTER — Ambulatory Visit: Payer: Self-pay

## 2016-10-26 NOTE — Lactation Note (Signed)
This note was copied from a baby's chart. Lactation Consult for Erica [name] (DOB: [birth year]) and mother, Erica [name]  Mother's reason for visit: "to discuss latching and pumping" Consult:  Initial Lactation Consultant:  Erica [name], Erica [name]  ________________________________________________________________________ BW: 3235g (7# 2.1oz) D/c weight: 3020g (down 6.6%), 6# 10oz 10-24-16: 6# 14 oz Today's weight: 7# 2.2oz ________________________________________________________________________  Mother's Name: Erica [name] Type of delivery:  Vaginal, Spontaneous Delivery Breastfeeding Experience: primip Maternal Medical Conditions:  anxiety Maternal Medications:  None  ________________________________________________________________________  Breastfeeding History (Post Discharge)  Frequency of breastfeeding: "on demand" has already fed 8 times today since MN Duration of feeding: 5-40 minutes  Pumping  Type of pump:  Medela pump in style Frequency: bid-tid Volume:  60-150 ml after a feeding  Infant Intake and Output Assessment  Voids: 5-8 in 24 hrs.  Color:  Clear yellow Stools: 5-8 in 24 hrs.  Color:  Yellow  ________________________________________________________________________  Maternal Breast Assessment  Breast:  Full Nipple:  Erect  _______________________________________________________________________ Feeding Assessment/Evaluation  Initial feeding assessment:  Infant's oral assessment:  WNL  Attached assessment:  Deep  Lips flanged:  Yes.     Suck assessment:  Nutritive  Tools:  Nipple shield 20 mm Instructed on use and cleaning of tool:  Yes.    Pre-feed weight: 3238 g    Post-feed weight: 3244 g Amount transferred: 6 ml R breast, a few minutes  Pre-feed weight: 3302 g (reweighed after being clothed w/diaper change) Post-feed weight: 3328 g    Amount transferred: 26 ml L breast, nipple shield (size 20) applied  Total  amount transferred: 32 ml  "Erica [name]" is 416 days old & is back to BW. Mom would like to get Erica [name] to latch without the nipple shield, but is sometimes unable to do so. Erica [name] will latch temporarily to the bare breast using the teacup hold, but she will not maintain that latch. Mom has been using a size 16 nipple shield, but a size 20 was used instead since its teat is less stiff & Erica [name] is showing signs of nipple confusion (e.g. Erica [name] can't find Mom's nipple even if the nipple is already in her mouth). I reassured Mom that it is ok that Erica [name] is still needing to use a nipple shield.   Mom has been using a  Size 21 flange on her L breast, a size 24 on the R breast. Mom observed pumping; she can use either size interchangeably (the size 24 does not pull into much areola & it is comfortable for her).    Mom was made aware of our BFSGs.   Mom noted to be anxious & seemed to overreact when husband was holding baby and Erica [name] lifted her head, appearing as if she were getting ready to gag (which resolved with position change). I reassured Mom that if the baby is pink and making noise, then everything is ok. When she left the room at one point, I inquired from [facility] & husband about her anxiety level. They answered that this is not unusual for her personality & this is likely a normal response to becoming a mother. I shared that if the anxiety seems to worsen or not improve after 2 weeks postpartum, then that could be a sign of PPMD.   Erica HewKim Caraline Deutschman, RN, [facility]CLC

## 2017-12-23 IMAGING — US US MFM OB FOLLOW-UP
1 series · 14 of 28 positions shown · non-contrast
Comparison: none

[Series 1: us mfm ob follow-up · 55 acquisitions, 14 frames shown]
[im 3/55]
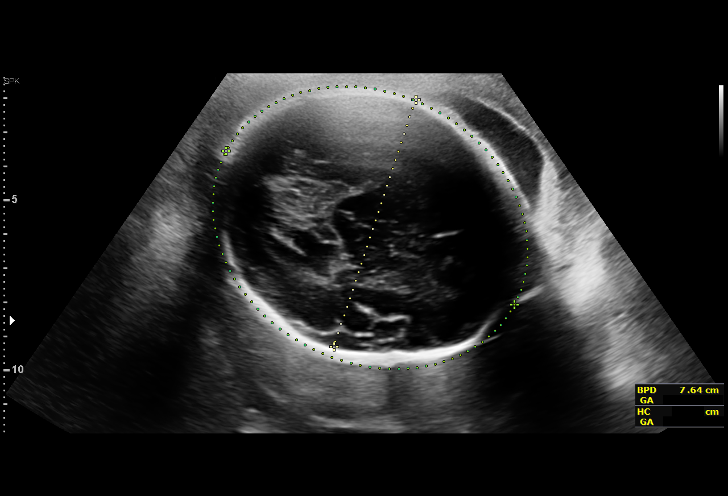
[im 7/55]
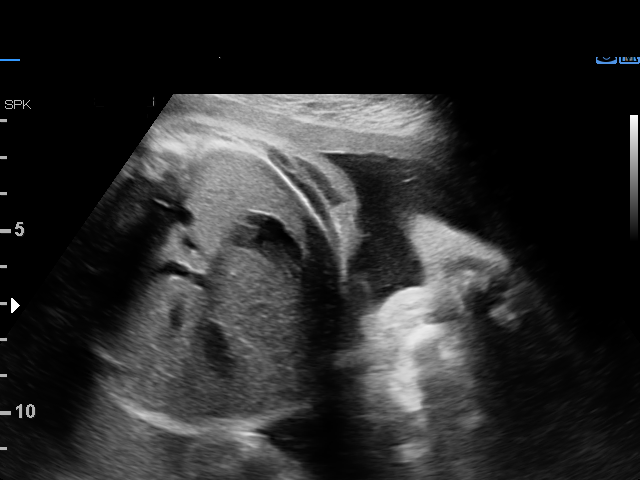
[im 11/55]
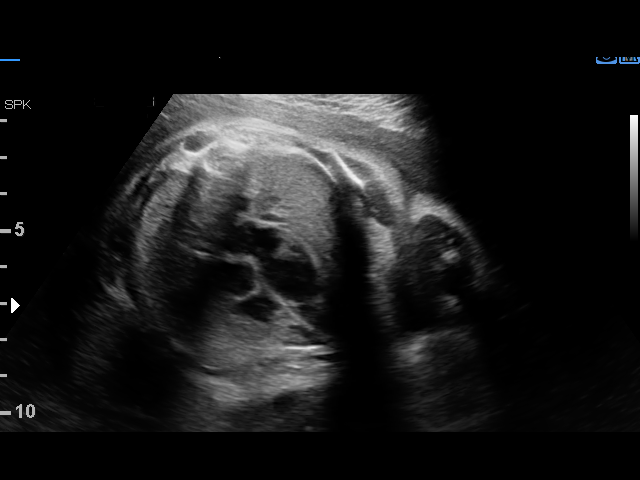
[im 15/55]
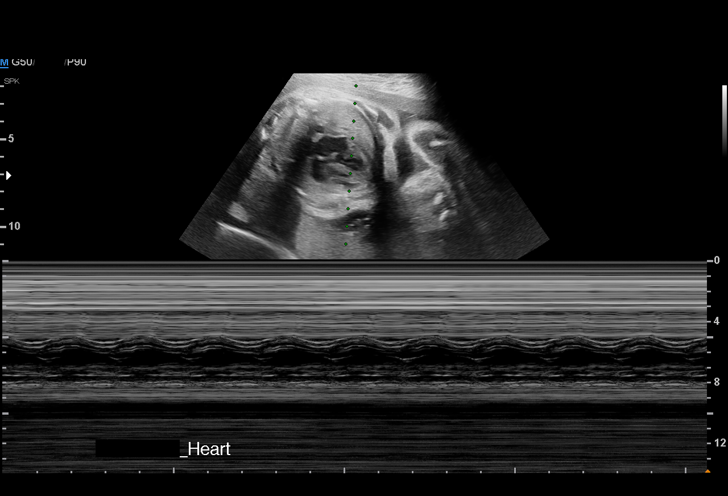
[im 19/55]
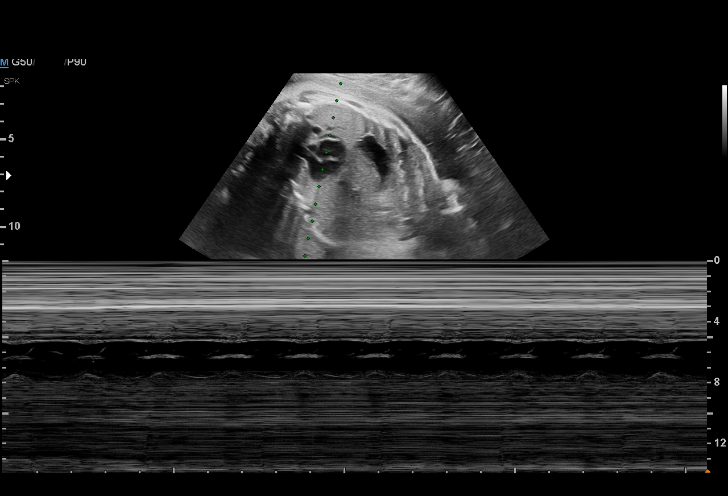
[im 23/55]
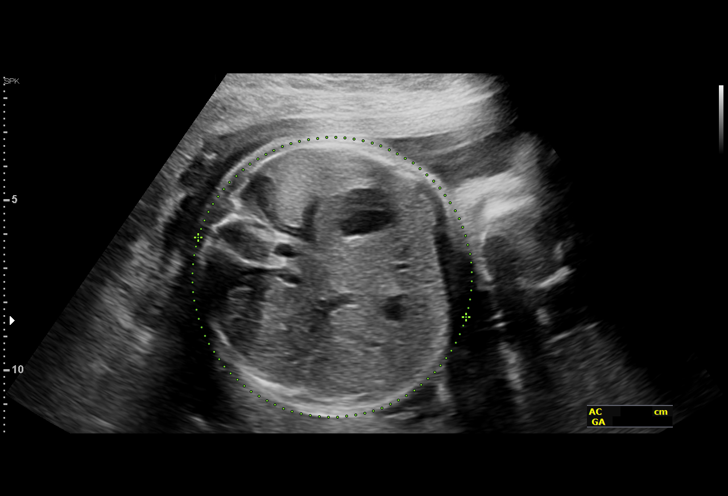
[im 27/55]
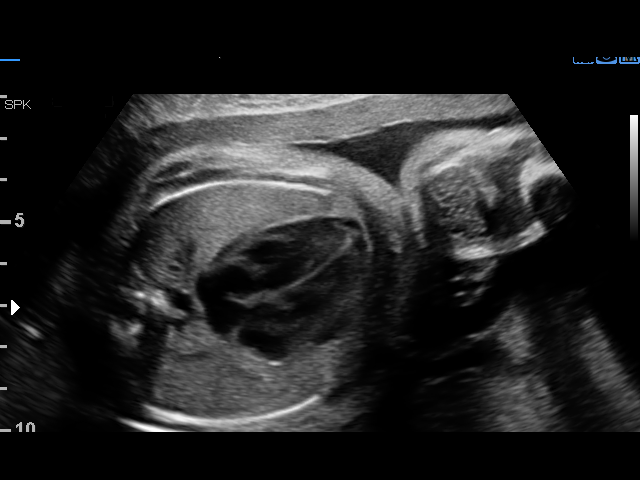
[im 31/55]
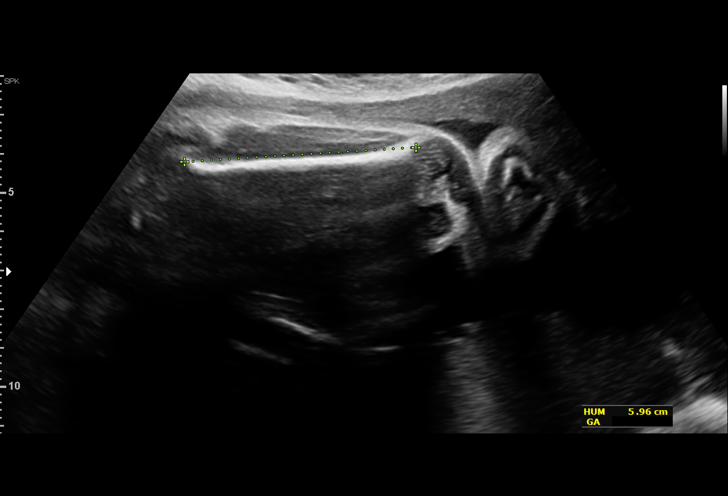
[im 35/55]
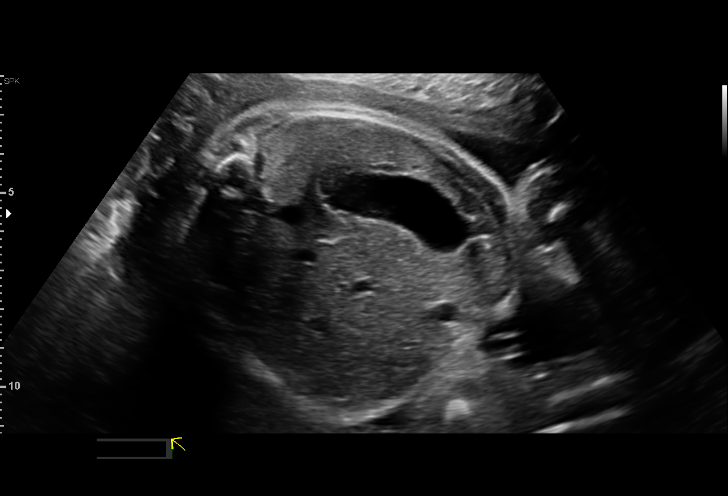
[im 39/55]
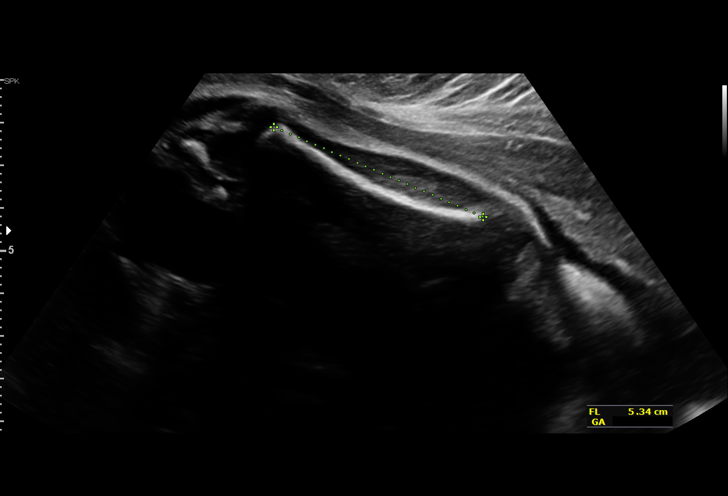
[im 43/55]
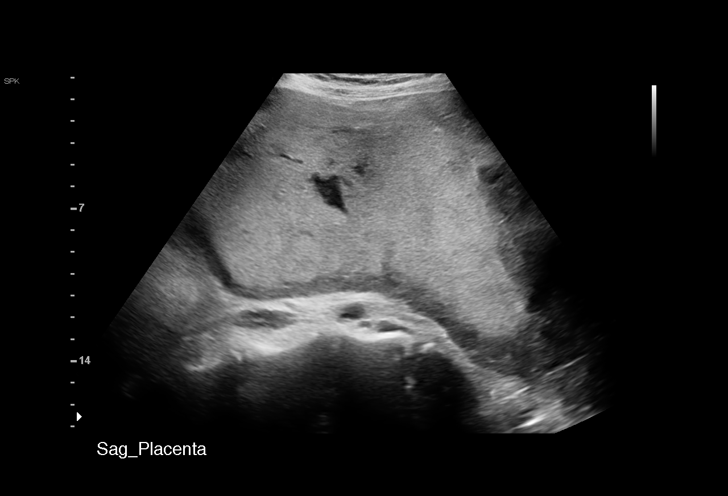
[im 47/55]
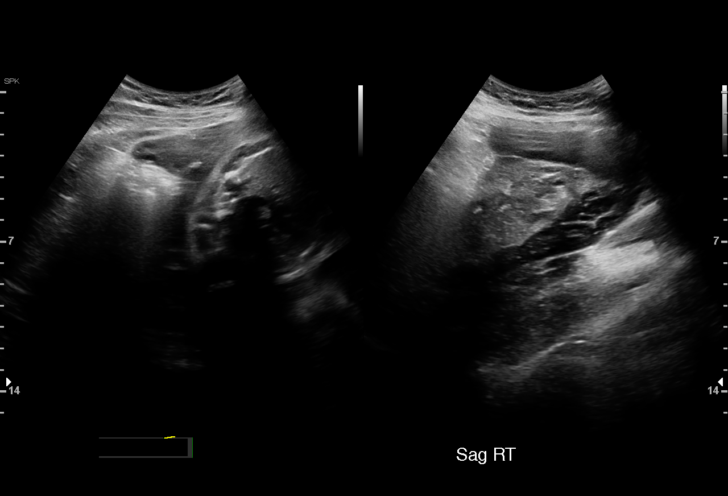
[im 51/55]
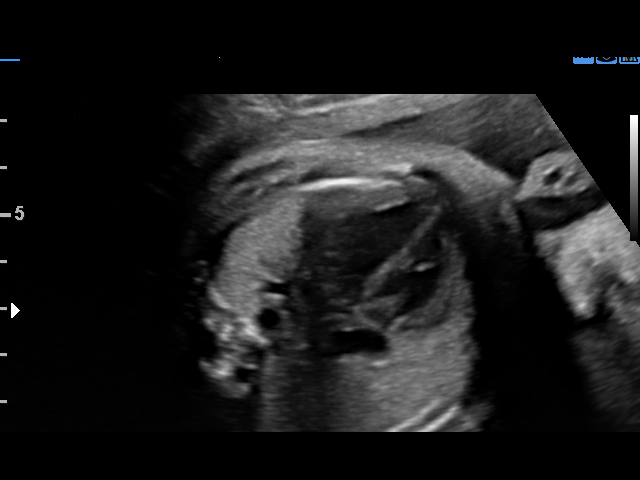
[im 55/55]
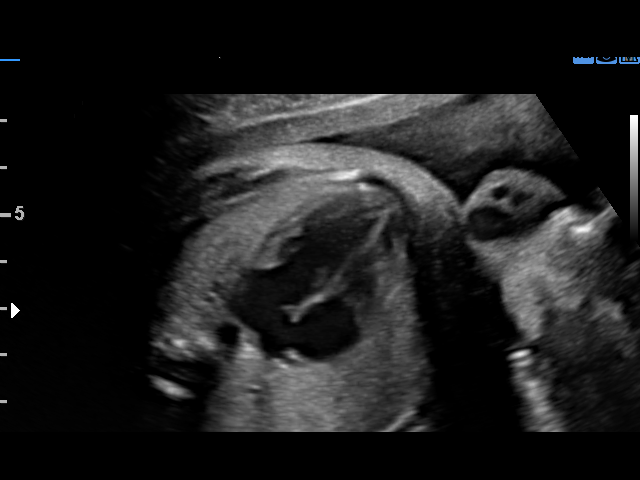

[14 of 28 positions shown; findings below may reference images not displayed]

Road; [HOSPITAL]

1  ARETE RIEDINGER              [id number]      [id number]     [id number]
Indications

30 weeks gestation of pregnancy
Fetal arrhythmia affecting pregnancy,          O76
antepartum
OB History

Blood Type:            Height:  5'7"   Weight (lb):  143      BMI:
Gravidity:    2         Term:   0        Prem:   0        SAB:   1
TOP:          0       Ectopic:  0        Living: 0
Fetal Evaluation

Num Of Fetuses:     1
Fetal Heart         140
Rate(bpm):
Cardiac Activity:   Observed
Presentation:       Cephalic
Placenta:           Posterior, above cervical os

AFI Sum(cm)     %Tile       Largest Pocket(cm)
14.76           51

RUQ(cm)       RLQ(cm)       LUQ(cm)        LLQ(cm)
3.16
Biometry

BPD:      76.9  mm     G. Age:  30w 6d         52  %    CI:        77.76   %   70 - 86
FL/HC:      19.7   %   19.2 -
HC:       276   mm     G. Age:  30w 1d         12  %    HC/AC:      1.07       0.99 -
AC:      258.9  mm     G. Age:  30w 1d         35  %    FL/BPD:     70.9   %   71 - 87
FL:       54.5  mm     G. Age:  28w 6d          6  %    FL/AC:      21.1   %   20 - 24
HUM:      52.3  mm     G. Age:  30w 3d         54  %
Est. FW:    2223  gm      3 lb 3 oz     39  %
Gestational Age

LMP:           32w 6d       Date:   12/21/15                 EDD:   09/26/16
U/S Today:     30w 0d                                        EDD:   10/16/16
Best:          30w 3d    Det. By:   Early Ultrasound         EDD:   10/13/16
(02/23/16)
Anatomy

Cranium:               Appears normal         Aortic Arch:            Previously seen
Cavum:                 Appears normal         Ductal Arch:            Previously seen
Ventricles:            Previously seen        Diaphragm:              Appears normal
Choroid Plexus:        Previously seen        Stomach:                Appears normal, left
sided
Cerebellum:            Previously seen        Abdomen:                Appears normal
Posterior Fossa:       Previously seen        Abdominal Wall:         Appears nml (cord
insert, abd wall)
Nuchal Fold:           Not applicable (>20    Cord Vessels:           Previously seen
wks GA)
Face:                  Orbits and profile     Kidneys:                Appear normal
previously seen
Lips:                  Previously seen        Bladder:                Appears normal
Thoracic:              Appears normal         Spine:                  Previously seen
Heart:                 Appears normal         Upper Extremities:      Previously seen
(4CH, axis, and situs
RVOT:                  Previously seen        Lower Extremities:      Previously seen
LVOT:                  Previously seen

Other:  Fetus appears to be a female. Technically difficult due to fetal position.
Cervix Uterus Adnexa

Cervix
Not visualized (advanced GA >75wks)

Uterus
Normal shape and size.

Left Ovary
Not visualized.

Right Ovary
Not visualized.

Adnexa:       No abnormality visualized.
Impression

Singleton intrauterine pregnancy at 30+0 weeks, previous
scan with rare fetal  PACs
Review of the anatomy shows no sonographic markers for
aneuploidy or structural anomalies
Amniotic fluid volume is normal with an AFI of 14.8 cm
Estimated fetal weight is 2223 g which is growth in the 39th
percentile
Prolonged evaluation of the heart using both B-mode  and M-
mode showed no fetal arrhythmia
Recommendations

Patents were reassured about fetal cardiac rhythm. Follow-up
ultrasounds as clinically indicated.
